# Patient Record
Sex: Female | Born: 1980 | Race: Black or African American | Hispanic: No | Marital: Single | Smoking: Current every day smoker
Health system: Southern US, Community
[De-identification: ages and names within clinical notes are randomized; demographics above are authoritative.]

## PROBLEM LIST (undated history)

## (undated) DIAGNOSIS — F329 Major depressive disorder, single episode, unspecified: Secondary | ICD-10-CM

## (undated) DIAGNOSIS — F32A Depression, unspecified: Secondary | ICD-10-CM

## (undated) DIAGNOSIS — R011 Cardiac murmur, unspecified: Secondary | ICD-10-CM

## (undated) DIAGNOSIS — J45909 Unspecified asthma, uncomplicated: Secondary | ICD-10-CM

## (undated) HISTORY — PX: OTHER SURGICAL HISTORY: SHX169

## (undated) HISTORY — PX: KNEE SURGERY: SHX244

---

## 2002-02-03 ENCOUNTER — Encounter: Admission: RE | Admit: 2002-02-03 | Discharge: 2002-02-03 | Payer: Self-pay | Admitting: *Deleted

## 2009-03-24 ENCOUNTER — Emergency Department: Payer: Self-pay | Admitting: Emergency Medicine

## 2009-03-28 ENCOUNTER — Emergency Department: Payer: Self-pay | Admitting: Emergency Medicine

## 2009-05-03 ENCOUNTER — Emergency Department: Payer: Self-pay | Admitting: Internal Medicine

## 2009-05-06 ENCOUNTER — Emergency Department: Payer: Self-pay | Admitting: Emergency Medicine

## 2009-05-27 ENCOUNTER — Emergency Department (HOSPITAL_BASED_OUTPATIENT_CLINIC_OR_DEPARTMENT_OTHER): Admission: EM | Admit: 2009-05-27 | Discharge: 2009-05-27 | Payer: Self-pay | Admitting: Emergency Medicine

## 2009-05-27 ENCOUNTER — Ambulatory Visit: Payer: Self-pay | Admitting: Interventional Radiology

## 2009-07-11 ENCOUNTER — Emergency Department (HOSPITAL_COMMUNITY): Admission: EM | Admit: 2009-07-11 | Discharge: 2009-07-11 | Payer: Self-pay | Admitting: Emergency Medicine

## 2009-09-01 ENCOUNTER — Emergency Department (HOSPITAL_COMMUNITY): Admission: EM | Admit: 2009-09-01 | Discharge: 2009-09-01 | Payer: Self-pay | Admitting: Emergency Medicine

## 2009-09-21 ENCOUNTER — Emergency Department (HOSPITAL_COMMUNITY): Admission: EM | Admit: 2009-09-21 | Discharge: 2009-09-22 | Payer: Self-pay | Admitting: Emergency Medicine

## 2009-10-02 ENCOUNTER — Ambulatory Visit: Payer: Self-pay | Admitting: Psychiatry

## 2009-10-02 ENCOUNTER — Inpatient Hospital Stay (HOSPITAL_COMMUNITY): Admission: AD | Admit: 2009-10-02 | Discharge: 2009-10-16 | Payer: Self-pay | Admitting: Psychiatry

## 2009-10-10 ENCOUNTER — Encounter: Payer: Self-pay | Admitting: Psychiatry

## 2010-05-11 LAB — URINALYSIS, ROUTINE W REFLEX MICROSCOPIC
Bilirubin Urine: NEGATIVE
Ketones, ur: NEGATIVE mg/dL
Nitrite: NEGATIVE
Nitrite: NEGATIVE
Protein, ur: NEGATIVE mg/dL
Protein, ur: NEGATIVE mg/dL
Specific Gravity, Urine: 1.018 (ref 1.005–1.030)
Specific Gravity, Urine: 1.038 — ABNORMAL HIGH (ref 1.005–1.030)
pH: 6 (ref 5.0–8.0)

## 2010-05-11 LAB — CBC
HCT: 43.3 % (ref 36.0–46.0)
HCT: 38.7 % (ref 36.0–46.0)
HCT: 43.8 % (ref 36.0–46.0)
Hemoglobin: 14.8 g/dL (ref 12.0–15.0)
Hemoglobin: 13.3 g/dL (ref 12.0–15.0)
MCH: 30.2 pg (ref 26.0–34.0)
MCHC: 34.3 g/dL (ref 30.0–36.0)
MCV: 88.3 fL (ref 78.0–100.0)
Platelets: 274 10*3/uL (ref 150–400)
RBC: 4.85 MIL/uL (ref 3.87–5.11)
RBC: 4.38 MIL/uL (ref 3.87–5.11)
RBC: 4.92 MIL/uL (ref 3.87–5.11)
RDW: 13.1 % (ref 11.5–15.5)
WBC: 10.6 10*3/uL — ABNORMAL HIGH (ref 4.0–10.5)
WBC: 9.1 10*3/uL (ref 4.0–10.5)

## 2010-05-11 LAB — COMPREHENSIVE METABOLIC PANEL
ALT: 31 U/L (ref 0–35)
AST: 20 U/L (ref 0–37)
AST: 15 U/L (ref 0–37)
Albumin: 3.7 g/dL (ref 3.5–5.2)
BUN: 7 mg/dL (ref 6–23)
BUN: 7 mg/dL (ref 6–23)
CO2: 29 mEq/L (ref 19–32)
Calcium: 8.5 mg/dL (ref 8.4–10.5)
Chloride: 105 mEq/L (ref 96–112)
Creatinine, Ser: 0.99 mg/dL (ref 0.4–1.2)
GFR calc Af Amer: >60 mL/min (ref >60)
GFR calc non Af Amer: >60 mL/min (ref >60)
GFR calc non Af Amer: >60 mL/min (ref >60)
Potassium: 3.5 mEq/L (ref 3.5–5.1)
Total Protein: 6.5 g/dL (ref 6.0–8.3)

## 2010-05-11 LAB — VALPROIC ACID LEVEL: Valproic Acid Lvl: 56.3 ug/mL (ref 50.0–100.0)

## 2010-05-11 LAB — URINE MICROSCOPIC-ADD ON

## 2010-05-11 LAB — URINE DRUGS OF ABUSE SCREEN W ALC, ROUTINE (REF LAB)
Barbiturate Quant, Ur: NEGATIVE
Benzodiazepines.: NEGATIVE
Cocaine Metabolites: NEGATIVE
Creatinine,U: 151.1 mg/dL
Marijuana Metabolite: NEGATIVE
Methadone: NEGATIVE

## 2010-05-11 LAB — TSH: TSH: 1.832 u[IU]/mL (ref 0.350–4.500)

## 2010-05-11 LAB — LIPID PANEL: Triglycerides: 103 mg/dL (ref <150)

## 2010-05-11 LAB — PREGNANCY, URINE: Preg Test, Ur: NEGATIVE

## 2010-05-11 LAB — RPR: RPR Ser Ql: NONREACTIVE

## 2010-05-12 LAB — URINALYSIS, ROUTINE W REFLEX MICROSCOPIC
Glucose, UA: NEGATIVE mg/dL
Ketones, ur: 15 mg/dL — AB
Nitrite: NEGATIVE
Specific Gravity, Urine: 1.017 (ref 1.005–1.030)
pH: 6 (ref 5.0–8.0)

## 2010-05-12 LAB — HEPATIC FUNCTION PANEL
Albumin: 3.8 g/dL (ref 3.5–5.2)
Alkaline Phosphatase: 54 U/L (ref 39–117)
Total Protein: 6.5 g/dL (ref 6.0–8.3)

## 2010-05-12 LAB — RAPID URINE DRUG SCREEN, HOSP PERFORMED
Barbiturates: NOT DETECTED
Benzodiazepines: NOT DETECTED
Cocaine: NOT DETECTED
Opiates: POSITIVE — AB

## 2010-05-12 LAB — POCT PREGNANCY, URINE: Preg Test, Ur: NEGATIVE

## 2010-05-12 LAB — BASIC METABOLIC PANEL
Calcium: 8.6 mg/dL (ref 8.4–10.5)
Chloride: 107 mEq/L (ref 96–112)
GFR calc non Af Amer: >60 mL/min (ref >60)
Potassium: 3.6 mEq/L (ref 3.5–5.1)

## 2010-05-12 LAB — POCT CARDIAC MARKERS
CKMB, poc: <1 ng/mL — ABNORMAL LOW (ref 1.0–8.0)
Myoglobin, poc: 29 ng/mL (ref 12–200)

## 2010-05-12 LAB — ETHANOL: Alcohol, Ethyl (B): 55 mg/dL — ABNORMAL HIGH (ref 0–10)

## 2010-05-12 LAB — CBC
Platelets: 340 10*3/uL (ref 150–400)
RDW: 12.6 % (ref 11.5–15.5)

## 2010-05-12 LAB — DIFFERENTIAL
Lymphocytes Relative: 28 % (ref 12–46)
Lymphs Abs: 3 10*3/uL (ref 0.7–4.0)
Monocytes Absolute: 0.8 10*3/uL (ref 0.1–1.0)
Neutro Abs: 6.9 10*3/uL (ref 1.7–7.7)

## 2010-05-14 LAB — URINALYSIS, ROUTINE W REFLEX MICROSCOPIC
Glucose, UA: NEGATIVE mg/dL
Ketones, ur: NEGATIVE mg/dL
Specific Gravity, Urine: 1.024 (ref 1.005–1.030)
pH: 5.5 (ref 5.0–8.0)

## 2010-05-14 LAB — URINE MICROSCOPIC-ADD ON

## 2010-05-14 LAB — PREGNANCY, URINE: Preg Test, Ur: NEGATIVE

## 2010-05-16 LAB — COMPREHENSIVE METABOLIC PANEL
ALT: 31 U/L (ref 0–35)
AST: 23 U/L (ref 0–37)
Albumin: 4.1 g/dL (ref 3.5–5.2)
CO2: 27 mEq/L (ref 19–32)
Chloride: 105 mEq/L (ref 96–112)
Creatinine, Ser: 0.8 mg/dL (ref 0.4–1.2)
GFR calc Af Amer: >60 mL/min (ref >60)
GFR calc non Af Amer: >60 mL/min (ref >60)
Sodium: 143 mEq/L (ref 135–145)
Total Bilirubin: 0.9 mg/dL (ref 0.3–1.2)

## 2010-05-16 LAB — URINALYSIS, ROUTINE W REFLEX MICROSCOPIC
Bilirubin Urine: NEGATIVE
Glucose, UA: NEGATIVE mg/dL
Hgb urine dipstick: NEGATIVE
Specific Gravity, Urine: 1.018 (ref 1.005–1.030)

## 2010-05-16 LAB — CBC
MCV: 88.2 fL (ref 78.0–100.0)
Platelets: 384 10*3/uL (ref 150–400)
RBC: 5.21 MIL/uL — ABNORMAL HIGH (ref 3.87–5.11)
WBC: 12.7 10*3/uL — ABNORMAL HIGH (ref 4.0–10.5)

## 2010-05-16 LAB — URINE MICROSCOPIC-ADD ON

## 2010-05-16 LAB — DIFFERENTIAL
Basophils Absolute: 0.1 10*3/uL (ref 0.0–0.1)
Eosinophils Absolute: 0.2 10*3/uL (ref 0.0–0.7)
Eosinophils Relative: 1 % (ref 0–5)
Lymphocytes Relative: 28 % (ref 12–46)
Lymphs Abs: 3.5 10*3/uL (ref 0.7–4.0)
Monocytes Absolute: 0.6 10*3/uL (ref 0.1–1.0)

## 2010-05-16 LAB — LIPASE, BLOOD: Lipase: 85 U/L (ref 23–300)

## 2010-05-16 LAB — URINE CULTURE

## 2010-05-26 IMAGING — CR DG CHEST 2V
1 series · 2 of 2 positions shown · non-contrast
Comparison: none

REASON FOR EXAM: cp
COMMENTS:

PROCEDURE:     DXR - DXR CHEST PA (OR AP) AND LATERAL  - May 03, 2009  [DATE]
RESULT:     The lung fields are clear. The heart, mediastinal and osseous
structures show no significant abnormalities. Compared to a prior exam
03/24/2009, no significant interval changes are seen.

[Series 1: view not recorded · 0.17mm/px · 2 of 2 slices shown]
[im 1/2]
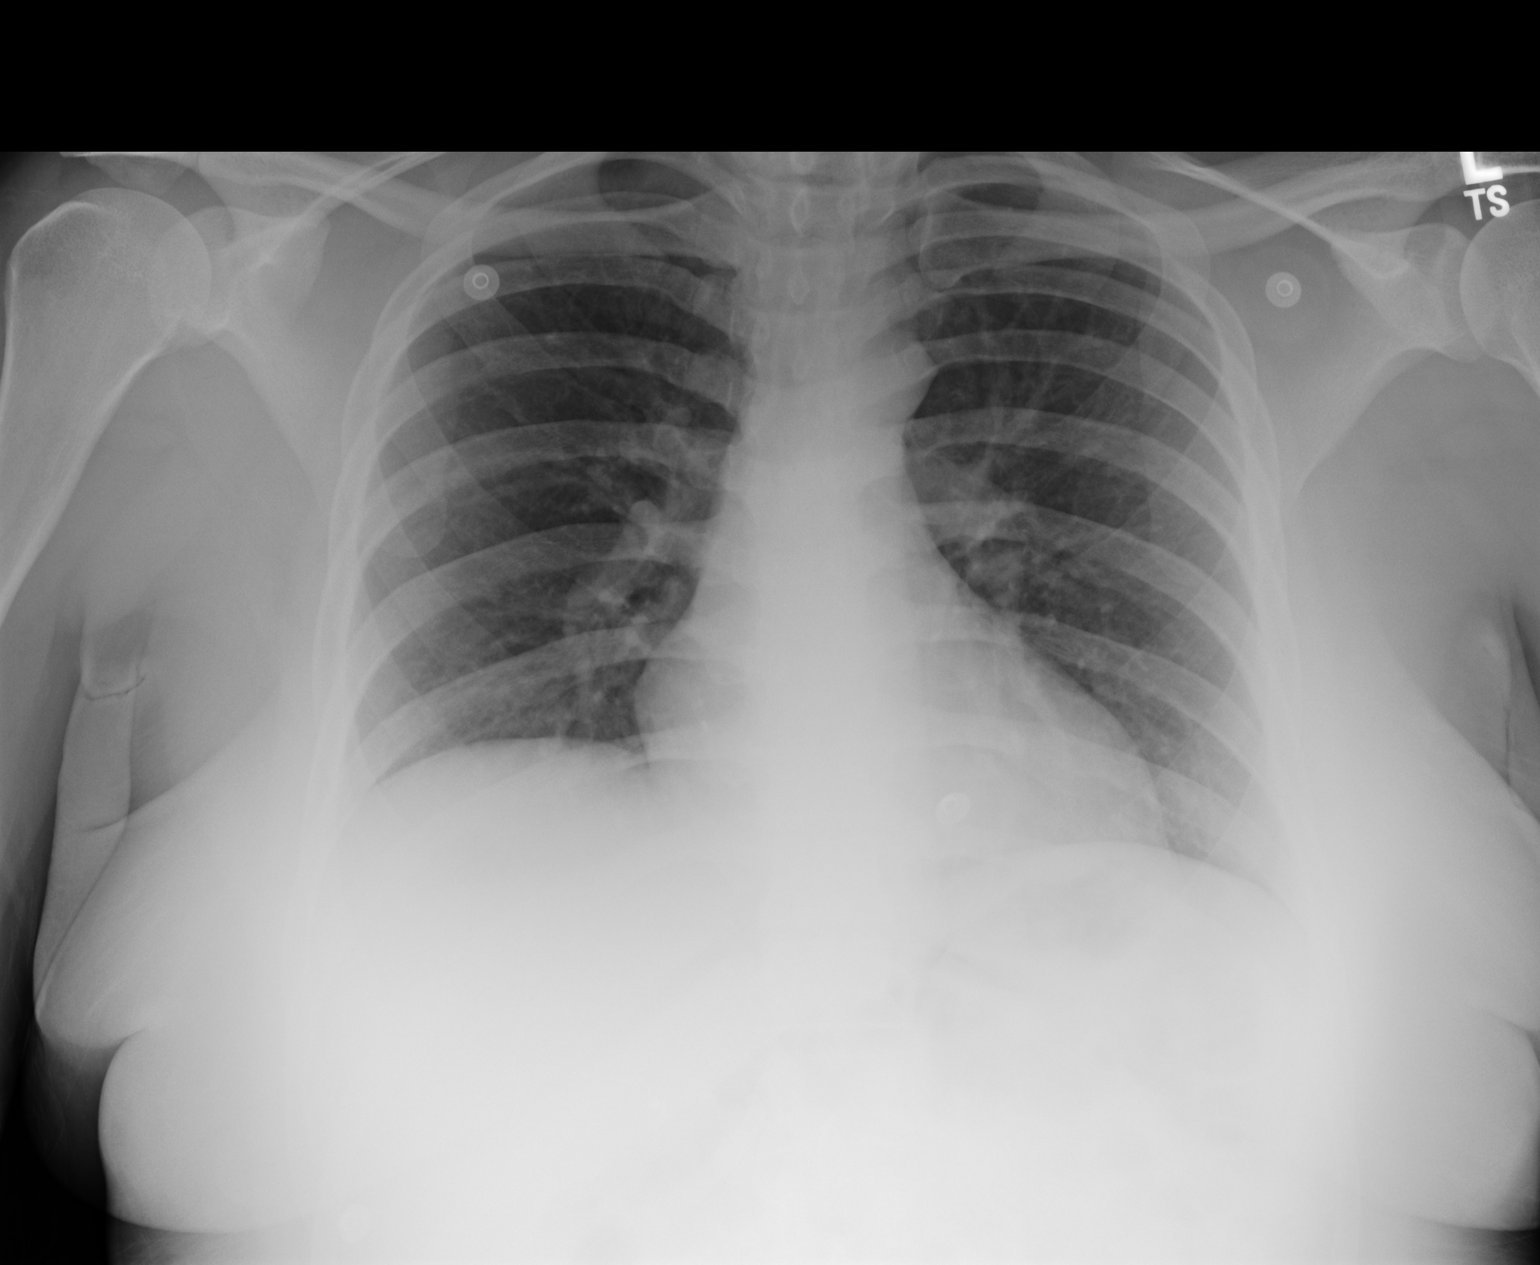
[im 2/2]
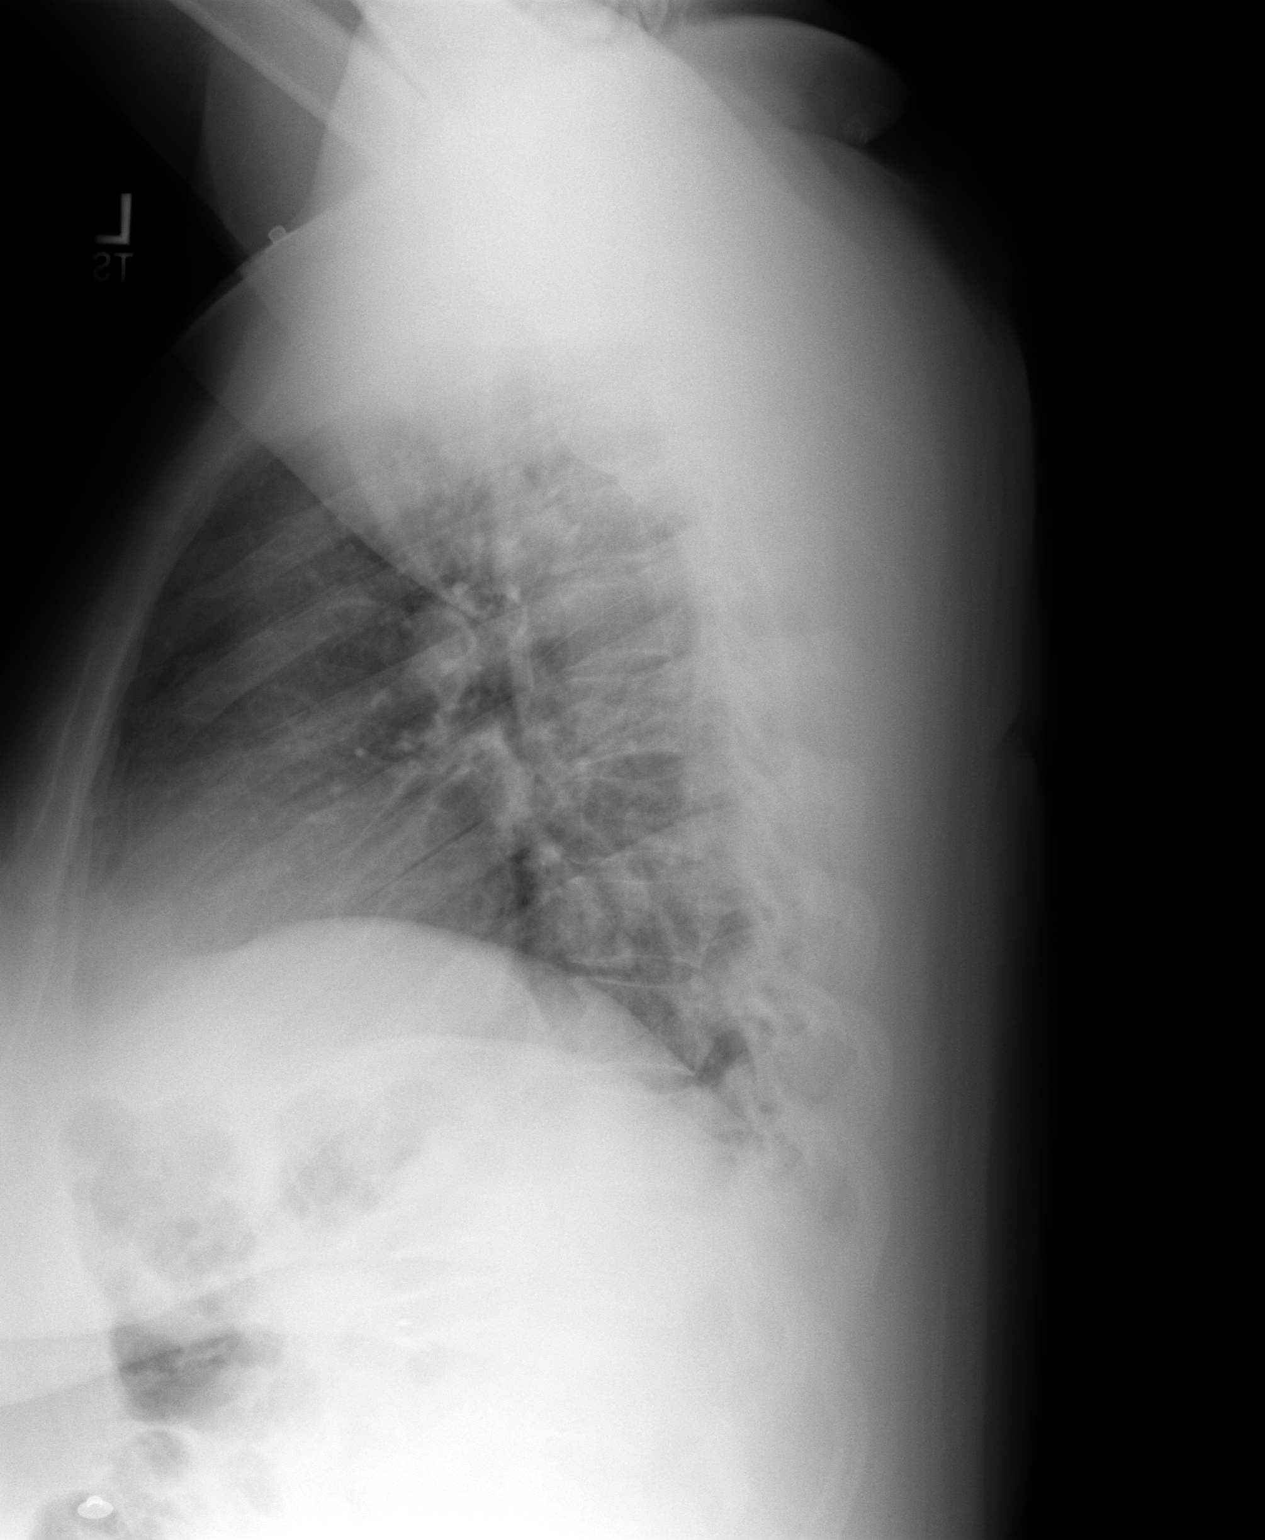

[2 of 2 positions shown; findings below may reference images not displayed]

IMPRESSION: 1.     No significant abnormalities are noted.

## 2010-08-02 ENCOUNTER — Inpatient Hospital Stay (HOSPITAL_COMMUNITY)
Admission: RE | Admit: 2010-08-02 | Discharge: 2010-08-14 | DRG: 885 | Disposition: A | Discharge status: Home / Self Care | Payer: Medicare Other | Attending: Psychiatry | Admitting: Psychiatry

## 2010-08-02 DIAGNOSIS — R45851 Suicidal ideations: Secondary | ICD-10-CM

## 2010-08-02 DIAGNOSIS — F259 Schizoaffective disorder, unspecified: Secondary | ICD-10-CM

## 2010-08-02 DIAGNOSIS — F603 Borderline personality disorder: Secondary | ICD-10-CM

## 2010-08-02 DIAGNOSIS — F339 Major depressive disorder, recurrent, unspecified: Principal | ICD-10-CM

## 2010-08-02 DIAGNOSIS — Z818 Family history of other mental and behavioral disorders: Secondary | ICD-10-CM

## 2010-08-02 DIAGNOSIS — I10 Essential (primary) hypertension: Secondary | ICD-10-CM

## 2010-08-02 DIAGNOSIS — E669 Obesity, unspecified: Secondary | ICD-10-CM

## 2010-08-02 LAB — COMPREHENSIVE METABOLIC PANEL
ALT: 36 U/L — ABNORMAL HIGH (ref 0–35)
BUN: 8 mg/dL (ref 6–23)
CO2: 28 mEq/L (ref 19–32)
Calcium: 9.3 mg/dL (ref 8.4–10.5)
GFR calc non Af Amer: >60 mL/min (ref >60)
Glucose, Bld: 113 mg/dL — ABNORMAL HIGH (ref 70–99)
Sodium: 137 mEq/L (ref 135–145)

## 2010-08-02 LAB — CBC
HCT: 42.7 % (ref 36.0–46.0)
Hemoglobin: 14 g/dL (ref 12.0–15.0)
MCH: 27.5 pg (ref 26.0–34.0)
MCHC: 32.8 g/dL (ref 30.0–36.0)
MCV: 83.9 fL (ref 78.0–100.0)

## 2010-08-02 LAB — DIFFERENTIAL
Basophils Absolute: 0 10*3/uL (ref 0.0–0.1)
Eosinophils Relative: 2 % (ref 0–5)
Lymphocytes Relative: 31 % (ref 12–46)
Lymphs Abs: 3.9 10*3/uL (ref 0.7–4.0)
Monocytes Absolute: 0.7 10*3/uL (ref 0.1–1.0)
Monocytes Relative: 5 % (ref 3–12)
Neutro Abs: 7.8 10*3/uL — ABNORMAL HIGH (ref 1.7–7.7)

## 2010-08-03 DIAGNOSIS — F333 Major depressive disorder, recurrent, severe with psychotic symptoms: Secondary | ICD-10-CM

## 2010-08-03 LAB — VITAMIN B12: Vitamin B-12: 559 pg/mL (ref 211–911)

## 2010-08-03 LAB — T4, FREE: Free T4: 1.12 ng/dL (ref 0.80–1.80)

## 2010-08-03 LAB — T3: T3, Total: 76.8 ng/dl — ABNORMAL LOW (ref 80.0–204.0)
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
## 2010-08-06 LAB — FOLATE RBC: RBC Folate: 748 ng/mL — ABNORMAL HIGH (ref >=366)

## 2010-08-08 LAB — T3 UPTAKE: T3 Uptake Ratio: 35.6 % (ref 22.5–37.0)

## 2010-08-15 NOTE — Discharge Summary (Signed)
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 Axis II:  Deferred. Axis III:  History of hypertension and obesity. Axis IV:  Psychosocial stressors. Axis V:  55.  PERTINENT LABS:  TSH is 0.550.  CBC shows a white count of 12.6.  Her CMP glucose of 113.  Vitamin B12 level was within normal limits at 559.  SIGNIFICANT FINDINGS:  The patient had to be placed on a one-to-one as she was reporting severe depression with command auditory hallucinations to kill herself.  She denied any visual hallucinations, but her paranoid delusions were reported.  She was started on Abilify 5 mg at bedtime, Lexapro 20 mg daily for depression, and trazodone 100 mg for sleep.  We had contact with the patient's mother to address any safety concerns and for [place] to provide information on suicide prevention.  The patient continued to endorse auditory hallucinations telling her to command type to hurt herself. She appeared very flat and depressed.  She was having thoughts of cutting her wrists but stated there is no way she could cut herself while in the unit.  She was making statements that no one cares about her.  She still remains unable to contract for safety and continued on the one-to-one.  Her sleep and appetite were good, although she appeared anxious and depressed.  Her voice was barely audible.  The patient barely initiated conversation and answered always in a low voice when giving responses.  The patient continued with her medications.  She was doing well on her Effexor and Geodon  having no side effects and was planning to move back to her mother's home at the time of discharge. The patient was endorsing problems with sleep, experiencing nightmares. She was having decreased appetite, rating her depression a 9 on a scale of 1-10 still unable to contract for safety, and we maintained the one- to-one.  We discontinued the Abilify, continue with her Geodon for hallucinations, increased her Effexor, and had trazodone for sleep.  The patient continued with severe depressive symptoms and was having constant suicidal thoughts but no plan or intent and endorsing auditory hallucinations to kill herself.  We increased her Geodon to 40 mg b.i.d. We had discontinued her Lexapro.  It was not helping and augmented the dose of the Effexor.  The patient received a B12 injection during her hospital stay.  On day prior to discharge, the patient had awoken early having moderate depressive symptoms rating it a 5.  She denied any suicidal thoughts.  Her auditory hallucinations had resolved.  She was getting some benefit from her trazodone.  We discontinued her one-to- one.  On day of discharge, the patient was reporting feeling better.  We had contact with the patient's mother to address any concerns  and to provide information.  On day of discharge, the patient's sleep was good. Appetite was good, having mild depressive symptoms rating it as a 2 on a scale of 1 to 10.  Adamantly denied any suicidal or homicidal thoughts. Denied any auditory or visual hallucinations.  No delusional thinking. Anxiety was under good control.  The patient was seen with the Interdisciplinary Treatment Team to address any issues.  The patient was discharged to outpatient care.  DISCHARGE MEDICATIONS: 1. Thyroid 30 mg taking one-half tab daily. 2. Effexor XR 75 mg taking 2 in the morning and 1 at 5:00 p.m. 3. Geodon 20 mg taking 2 daily and 3 at bedtime. 4. The patient was to stop taking her Topamax,  lithium, and Zoloft.  FOLLOWUP:  Her followup appointment was with [facility] on Thursday, June 21, at phone number [phone].     Landry Corporal, N.P.   ______________________________ Franchot Gallo, MD    JO/MEDQ  D:  08/15/2010  T:  08/15/2010  Job:  [id number]  Electronically Signed by Limmie PatriciaP. on 08/15/2010 02:40:08 PM Electronically Signed by Franchot Gallo MD on 08/15/2010 04:58:39 PM

## 2012-06-02 ENCOUNTER — Telehealth (HOSPITAL_COMMUNITY): Payer: Self-pay | Admitting: *Deleted

## 2012-06-03 NOTE — Telephone Encounter (Signed)
error 

## 2015-05-02 ENCOUNTER — Encounter (HOSPITAL_BASED_OUTPATIENT_CLINIC_OR_DEPARTMENT_OTHER): Payer: Self-pay

## 2015-05-02 ENCOUNTER — Emergency Department (HOSPITAL_BASED_OUTPATIENT_CLINIC_OR_DEPARTMENT_OTHER)
Admission: EM | Admit: 2015-05-02 | Discharge: 2015-05-03 | Disposition: A | Discharge status: Home / Self Care | Payer: Medicare Other | Attending: Emergency Medicine | Admitting: Emergency Medicine

## 2015-05-02 DIAGNOSIS — R011 Cardiac murmur, unspecified: Secondary | ICD-10-CM | POA: Diagnosis not present

## 2015-05-02 DIAGNOSIS — W228XXA Striking against or struck by other objects, initial encounter: Secondary | ICD-10-CM | POA: Insufficient documentation

## 2015-05-02 DIAGNOSIS — J45909 Unspecified asthma, uncomplicated: Secondary | ICD-10-CM | POA: Insufficient documentation

## 2015-05-02 DIAGNOSIS — F17200 Nicotine dependence, unspecified, uncomplicated: Secondary | ICD-10-CM | POA: Diagnosis not present

## 2015-05-02 DIAGNOSIS — S0592XA Unspecified injury of left eye and orbit, initial encounter: Secondary | ICD-10-CM | POA: Diagnosis present

## 2015-05-02 DIAGNOSIS — Y998 Other external cause status: Secondary | ICD-10-CM | POA: Insufficient documentation

## 2015-05-02 DIAGNOSIS — Y9389 Activity, other specified: Secondary | ICD-10-CM | POA: Insufficient documentation

## 2015-05-02 DIAGNOSIS — Y9289 Other specified places as the place of occurrence of the external cause: Secondary | ICD-10-CM | POA: Diagnosis not present

## 2015-05-02 HISTORY — DX: Depression, unspecified: F32.A

## 2015-05-02 HISTORY — DX: Major depressive disorder, single episode, unspecified: F32.9

## 2015-05-02 HISTORY — DX: Unspecified asthma, uncomplicated: J45.909

## 2015-05-02 HISTORY — DX: Cardiac murmur, unspecified: R01.1

## 2015-05-02 NOTE — ED Notes (Signed)
Patient called for triage x2, no answer °
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
## 2015-07-27 DEATH — deceased
